# Patient Record
Sex: Male | Born: 1975 | Race: Black or African American | Hispanic: No | Marital: Single | Smoking: Never smoker
Health system: Southern US, Community
[De-identification: ages and names within clinical notes are randomized; demographics above are authoritative.]

## PROBLEM LIST (undated history)

## (undated) DIAGNOSIS — R569 Unspecified convulsions: Secondary | ICD-10-CM

## (undated) HISTORY — PX: OTHER SURGICAL HISTORY: SHX169

## (undated) HISTORY — DX: Unspecified convulsions: R56.9

---

## 1997-09-22 ENCOUNTER — Emergency Department (HOSPITAL_COMMUNITY): Admission: EM | Admit: 1997-09-22 | Discharge: 1997-09-22 | Payer: Self-pay | Admitting: Emergency Medicine

## 2002-04-20 ENCOUNTER — Emergency Department (HOSPITAL_COMMUNITY): Admission: EM | Admit: 2002-04-20 | Discharge: 2002-04-20 | Payer: Self-pay | Admitting: Emergency Medicine

## 2002-04-20 ENCOUNTER — Encounter: Payer: Self-pay | Admitting: Emergency Medicine

## 2003-12-28 ENCOUNTER — Emergency Department (HOSPITAL_COMMUNITY): Admission: EM | Admit: 2003-12-28 | Discharge: 2003-12-28 | Payer: Self-pay | Admitting: Emergency Medicine

## 2006-03-31 ENCOUNTER — Emergency Department (HOSPITAL_COMMUNITY): Admission: EM | Admit: 2006-03-31 | Discharge: 2006-03-31 | Payer: Self-pay | Admitting: Emergency Medicine

## 2009-08-24 ENCOUNTER — Emergency Department (HOSPITAL_COMMUNITY): Admission: EM | Admit: 2009-08-24 | Discharge: 2009-08-24 | Payer: Self-pay | Admitting: Emergency Medicine

## 2011-07-31 IMAGING — CR DG FINGER INDEX 2+V*L*
3 series · 3 of 3 positions shown · non-contrast
Comparison: None

CLINICAL DATA: Pain and distal index finger.  The patient popped
finger, joint yesterday.  Hurts worse today.  Swelling.

LEFT INDEX FINGER 2+V

[x finger pa right]
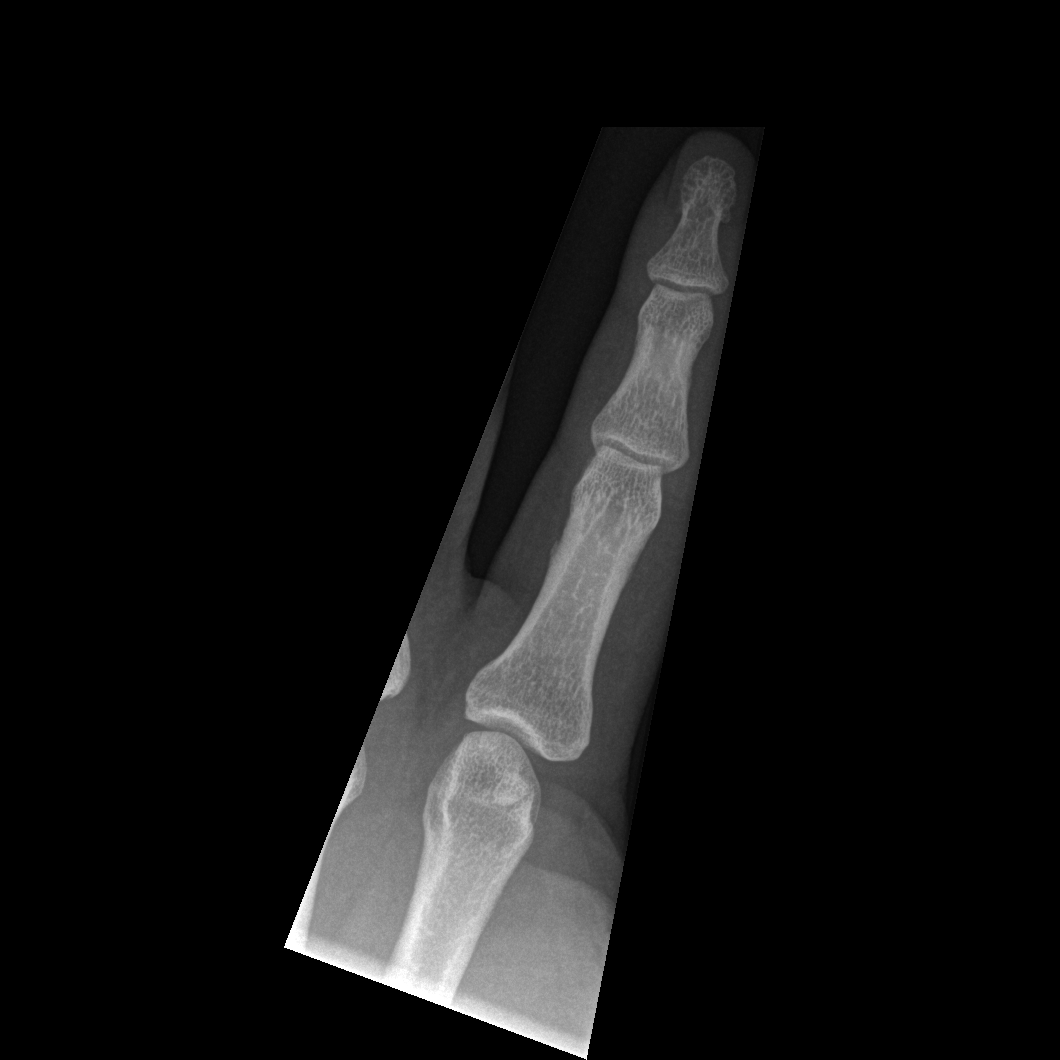

[x finger obl. right]
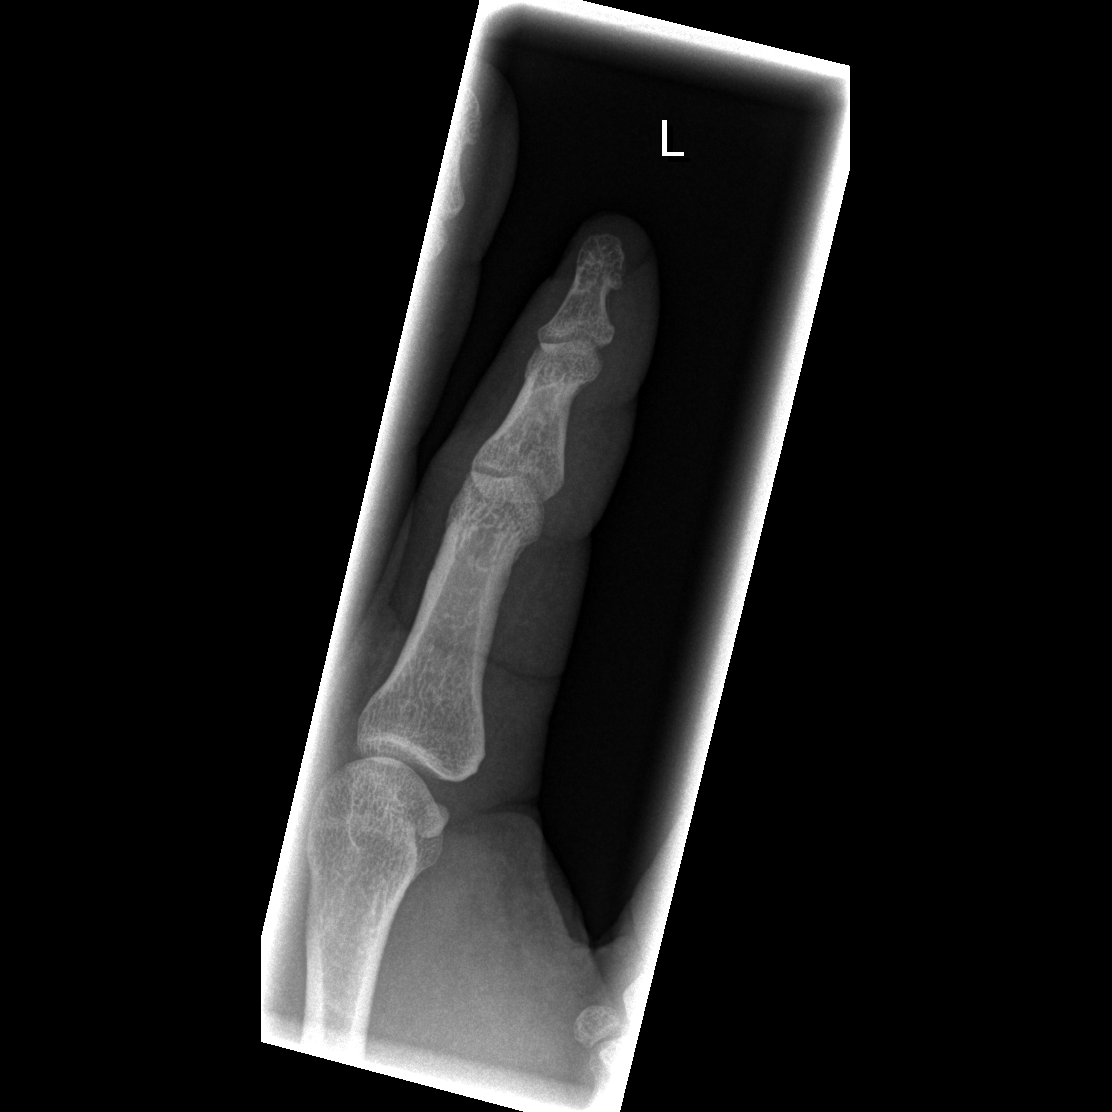

[x finger lateral right]
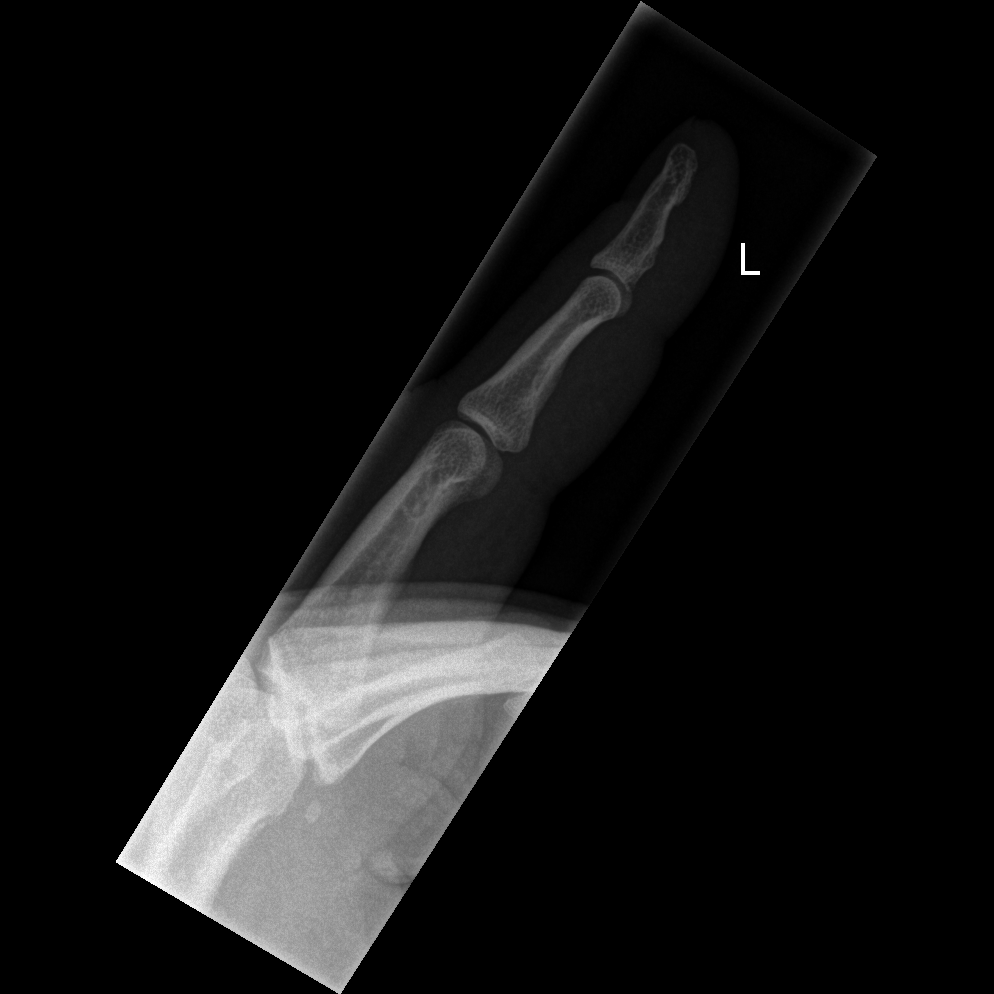

[3 of 3 positions shown; findings below may reference images not displayed]

FINDINGS: There is no evidence for acute fracture or dislocation.
No soft tissue foreign body or gas identified.
IMPRESSION: Negative exam.

## 2012-01-10 ENCOUNTER — Emergency Department (HOSPITAL_COMMUNITY)
Admission: EM | Admit: 2012-01-10 | Discharge: 2012-01-10 | Disposition: A | Discharge status: Home / Self Care | Payer: BC Managed Care – PPO | Attending: Emergency Medicine | Admitting: Emergency Medicine

## 2012-01-10 ENCOUNTER — Encounter (HOSPITAL_COMMUNITY): Payer: Self-pay | Admitting: Family Medicine

## 2012-01-10 ENCOUNTER — Emergency Department (HOSPITAL_COMMUNITY): Payer: BC Managed Care – PPO

## 2012-01-10 DIAGNOSIS — R079 Chest pain, unspecified: Secondary | ICD-10-CM | POA: Insufficient documentation

## 2012-01-10 LAB — CBC WITH DIFFERENTIAL/PLATELET
Basophils Absolute: 0 10*3/uL (ref 0.0–0.1)
Basophils Relative: 0 % (ref 0–1)
Eosinophils Absolute: 0.2 10*3/uL (ref 0.0–0.7)
Eosinophils Relative: 2 % (ref 0–5)
HCT: 47 % (ref 39.0–52.0)
Hemoglobin: 16.2 g/dL (ref 13.0–17.0)
Lymphocytes Relative: 30 % (ref 12–46)
Lymphs Abs: 2.3 10*3/uL (ref 0.7–4.0)
MCH: 29.8 pg (ref 26.0–34.0)
MCHC: 34.5 g/dL (ref 30.0–36.0)
MCV: 86.4 fL (ref 78.0–100.0)
Monocytes Absolute: 0.5 10*3/uL (ref 0.1–1.0)
Monocytes Relative: 7 % (ref 3–12)
Neutro Abs: 4.7 10*3/uL (ref 1.7–7.7)
Neutrophils Relative %: 61 % (ref 43–77)
Platelets: 236 10*3/uL (ref 150–400)
RBC: 5.44 MIL/uL (ref 4.22–5.81)
RDW: 14 % (ref 11.5–15.5)
WBC: 7.7 10*3/uL (ref 4.0–10.5)

## 2012-01-10 LAB — COMPREHENSIVE METABOLIC PANEL
ALT: 16 U/L (ref 0–53)
AST: 19 U/L (ref 0–37)
Albumin: 4 g/dL (ref 3.5–5.2)
Alkaline Phosphatase: 83 U/L (ref 39–117)
BUN: 10 mg/dL (ref 6–23)
CO2: 24 mEq/L (ref 19–32)
Calcium: 9.5 mg/dL (ref 8.4–10.5)
Chloride: 101 mEq/L (ref 96–112)
Creatinine, Ser: 1.18 mg/dL (ref 0.50–1.35)
GFR calc Af Amer: >90 mL/min (ref >90)
GFR calc non Af Amer: 79 mL/min — ABNORMAL LOW (ref >90)
Glucose, Bld: 103 mg/dL — ABNORMAL HIGH (ref 70–99)
Potassium: 4.1 mEq/L (ref 3.5–5.1)
Sodium: 138 mEq/L (ref 135–145)
Total Bilirubin: 0.2 mg/dL — ABNORMAL LOW (ref 0.3–1.2)
Total Protein: 7.7 g/dL (ref 6.0–8.3)

## 2012-01-10 MED ORDER — OMEPRAZOLE 20 MG PO CPDR: 20.0000 mg | DELAYED_RELEASE_CAPSULE | Freq: Every day | ORAL | Status: AC

## 2012-01-10 NOTE — ED Notes (Signed)
Pt complaining of intermittent chest pain over the past year but worsening last night. sts pressure all over his chest and sharp pain in left breast area. sts sometimes he gets SOB and has some nausea. sts the pain radiated into his back. sts the pain is worse with deep breathing.

## 2012-01-10 NOTE — ED Notes (Signed)
Results of troponin per POCT standards:   0.00 

## 2012-01-10 NOTE — ED Provider Notes (Signed)
History     CSN: [id number]  Arrival date & time 01/10/12  1316   First MD Initiated Contact with Patient 01/10/12 1759      Chief Complaint  Patient presents with  . Chest Pain    (Consider location/radiation/quality/duration/timing/severity/associated sxs/prior treatment) HPI This 36 year old male has had about a year history of intermittent chest discomforts and slight shortness of breath that can last for hours to day or 2 a time every few weeks or so. His symptoms are nonexertional and nonpleuritic. They are sometimes associated with eating certain foods making the discomfort, however. He has had occasional nausea and self-induced emesis when he said the discomfort for several hours after eating certain foods. He is no bloody vomiting. He is no bloody stools. He has no abdominal pain. His chest discomfort is felt as a gradual onset mild pressure-like and become moderately severe at times but again is nonexertional. He did workout at the gym without difficulty whatsoever. He can walk without difficulty whatsoever. He is no fever cough exertional shortness of breath leg swelling leg pain recent travel or immobilization. There is no treatment prior to arrival. He said his current symptoms since yesterday all evening yesterday and all night and all day today without change in activity. His mild chest pressure sensation is across the entire anterior chest without radiation or associated symptoms today. History reviewed. No pertinent past medical history.  No past surgical history on file.  No family history on file.  History  Substance Use Topics  . Smoking status: Not on file  . Smokeless tobacco: Not on file  . Alcohol Use: Not on file      Review of Systems 10 Systems reviewed and are negative for acute change except as noted in the HPI. Allergies  Review of patient's allergies indicates no known allergies.  Home Medications   Current Outpatient Rx  Name Route Sig Dispense  Refill  . OMEPRAZOLE 20 MG PO CPDR Oral Take 1 capsule (20 mg total) by mouth daily. 5 capsule 0    BP 150/102  Pulse 63  Temp 98.5 F (36.9 C) (Oral)  Resp 16  SpO2 100%  Physical Exam  Nursing note and vitals reviewed. Constitutional:       Awake, alert, nontoxic appearance.  HENT:  Head: Atraumatic.  Eyes: Right eye exhibits no discharge. Left eye exhibits no discharge.  Neck: Neck supple.  Cardiovascular: Normal rate and regular rhythm.   No murmur heard. Pulmonary/Chest: Effort normal and breath sounds normal. No respiratory distress. He has no wheezes. He has no rales. He exhibits no tenderness.  Abdominal: Soft. There is no tenderness. There is no rebound.  Musculoskeletal: He exhibits no edema and no tenderness.       Baseline ROM, no obvious new focal weakness.  Neurological:       Mental status and motor strength appears baseline for patient and situation.  Skin: No rash noted.  Psychiatric: He has a normal mood and affect.    ED Course  Procedures (including critical care time) ECG: (performed 1325) reviewed 1800 normal sinus rhythm, ventricular rate 83, normal axis, normal intervals, nonspecific T wave changes, no comparison ECG available  PERC neg.  Labs Reviewed  COMPREHENSIVE METABOLIC PANEL - Abnormal; Notable for the following:    Glucose, Bld 103 (*)     Total Bilirubin 0.2 (*)     GFR calc non Af Amer 79 (*)     All other components within normal limits  CBC WITH DIFFERENTIAL  LAB REPORT - SCANNED   Dg Chest 2 View  01/10/2012  *RADIOLOGY REPORT*  Clinical Data: Chest pain  CHEST - 2 VIEW  Comparison: None.  Findings: Normal heart, mediastinal, and hilar contours.  Normal pulmonary vascularity.  The lungs are normally expanded and clear. There is no pleural effusion.  The trachea is midline.  The bony thorax and visualized upper abdomen appear within normal limits  IMPRESSION: Normal chest radiograph.   Original Report Authenticated By: Britta Mccreedy,  M.D.      1. Chest pain       MDM  Patient / Family / Caregiver understand and agree with initial ED impression and plan with expectations set for ED visit. Pt stable in ED with no significant deterioration in condition. Patient / Family / Caregiver informed of clinical course, understand medical decision-making process, and agree with plan. I doubt any other EMC precluding discharge at this time including, but not necessarily limited to the following:ACS, PE.        Hurman Horn, MD 01/11/12 2245

## 2012-10-02 ENCOUNTER — Telehealth: Payer: Self-pay

## 2012-10-02 NOTE — Telephone Encounter (Signed)
PT STATES HE WAS GIVEN A CREAM AND NEVER GOT IT FILLED, HE WOULD LIKE [place] TO CALL IT IN FOR HIM. PLEASE CALL [phone]   [facility] ON [address]

## 2012-10-02 NOTE — Telephone Encounter (Signed)
Patient calling again to see if his Rx for the cream that he didn't pick up can be filled again because he said the request was expired.   [phone]

## 2012-10-02 NOTE — Telephone Encounter (Signed)
Left message to return call. Don't see OV. Need details

## 2012-10-03 NOTE — Telephone Encounter (Signed)
Advised pt that he would need an office visit.  He was given an rx for Elimite Cream by Dr. 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

## 2014-02-14 ENCOUNTER — Ambulatory Visit (INDEPENDENT_AMBULATORY_CARE_PROVIDER_SITE_OTHER): Payer: No Typology Code available for payment source | Admitting: Neurology

## 2014-02-14 ENCOUNTER — Encounter: Payer: Self-pay | Admitting: Neurology

## 2014-02-14 VITALS — BP 130/88 | HR 74 | Ht 70.0 in | Wt 230.0 lb

## 2014-02-14 DIAGNOSIS — R569 Unspecified convulsions: Secondary | ICD-10-CM

## 2014-02-14 DIAGNOSIS — R55 Syncope and collapse: Secondary | ICD-10-CM

## 2014-02-14 DIAGNOSIS — R5601 Complex febrile convulsions: Secondary | ICD-10-CM

## 2014-02-14 MED ORDER — SUVOREXANT 20 MG PO TABS: 20.0000 mg | ORAL_TABLET | Freq: Every evening | ORAL | Status: AC | PRN

## 2014-02-14 NOTE — Progress Notes (Signed)
[facility]    Provider:  Dr Lucia GaskinsAhern Referring Provider: Milus Heightedmon, Noelle, PA-C Primary Care Physician:  REDMON,NOELLE, PA-C  CC:  Seizures  HPI:  Douglas Armstrong is a 38 y.o. male here as a referral from Dr. Sherlyn Lickedmon for seizures vs syncope/presyncope.  He has episodes where he falls down and goes out for a second. All started 3-4 years ago. One day he felt dizzy and lightheaded and next thing he knew he was on the floor. Quick, completely blacks out briefly and he comes back and knows exactly where he is. Stress makes these episodes worse. He gets chest pain, SOB, feels heart racing. At other times when he gets dizzy, his vision gets real blurry and hard to focus and gets lightheaded and has to sit down or hold onto sometimes. Sometimes he completely goes black and falls for a moment. Once he dropped straight down and passed out by the TV and broke it.   One episode recently where he was on the couch and he was trying to talk and he was slurring speech and muffled hearing. He could hear what she was saying but couldn't respond, started drooling and when he came back he did not have confusion.   Passing out happens every now and then,  It may not happen in a week then may happen several times in a week. No FHx seizures. No seizures as a chiled    Reviewed notes, labs and imaging from outside physicians, which showed: cbc/cmp unremarkable  Review of Systems: Patient complains of symptoms per HPI as well as the following symptoms Insomnia. No pain. Pertinent negatives per HPI. All others negative.   History   Social History  . Marital Status: Single    Spouse Name: N/A    Number of Children: N/A  . Years of Education: N/A   Occupational History  . Not on file.   Social History Main Topics  . Smoking status: Never Smoker   . Smokeless tobacco: Never Used  . Alcohol Use: 8.4 oz/week    14 Cans of beer per week  . Drug Use: Not on file  . Sexual Activity:  Not on file   Other Topics Concern  . Not on file   Social History Narrative  . No narrative on file    Family History  Problem Relation Age of Onset  . Sleep apnea Mother     History reviewed. No pertinent past medical history.  History reviewed. No pertinent past surgical history.  Current Outpatient Prescriptions  Medication Sig Dispense Refill  . omeprazole (PRILOSEC) 20 MG capsule Take 1 capsule (20 mg total) by mouth daily.  5 capsule  0  . Suvorexant (BELSOMRA) 20 MG TABS Take 20 mg by mouth at bedtime as needed.  3 tablet  0   No current facility-administered medications for this visit.    Allergies as of 02/14/2014  . (No Known Allergies)    Vitals: BP 130/88  Pulse 74  Ht 5\' 10"  (1.778 m)  Wt 230 lb (104.327 kg)  BMI 33.00 kg/m2 Last Weight:  Wt Readings from Last 1 Encounters:  02/14/14 230 lb (104.327 kg)   Last Height:   Ht Readings from Last 1 Encounters:  02/14/14 5\' 10"  (1.778 m)    Physical exam: Exam: Gen: NAD, conversant, well nourised, obese, well groomed                     CV: RRR, no MRG. No  Carotid Bruits. No peripheral edema, warm, nontender Eyes: Conjunctivae clear without exudates or hemorrhage  Neuro: Detailed Neurologic Exam  Speech:    Speech is normal; fluent and spontaneous with normal comprehension.  Cognition:    The patient is oriented to person, place, and time;     recent and remote memory intact;     language fluent;     normal attention, concentration,     fund of knowledge Cranial Nerves:    The pupils are equal, round, and reactive to light. The fundi are normal and spontaneous venous pulsations are present. Visual fields are full to finger confrontation. Extraocular movements are intact. Trigeminal sensation is intact and the muscles of mastication are normal. The face is symmetric. The palate elevates in the midline. Voice is normal. Shoulder shrug is normal. The tongue has normal motion without fasciculations.     Coordination:    Normal finger to nose and heel to shin. Normal rapid alternating movements.   Gait:    Heel-toe and tandem gait are normal.   Motor Observation:    No asymmetry, no atrophy, and no involuntary movements noted. Tone:    Normal muscle tone.    Posture:    Posture is normal. normal erect    Strength:    Strength is V/V in the upper and lower limbs.      Sensation: intact     Reflex Exam:  DTR's:    Deep tendon reflexes in the upper and lower extremities are normal bilaterally.   Toes:    The toes are downgoing bilaterally.   Clonus:    Clonus is absent.      Assessment/Plan:  38 year old male here for evaluation. Seizure vs syncope/presyncope. Neuro exam non focal. Belsomra for insomnia.  MRI of the brain w/wo contrast EEG Carotid Dopplers Cardiology  No driving until workup complete. Do not bathe alone or operate heave equipment or anything that can cause harm to you or others should you have an event.   Naomie DeanAntonia Quenna Doepke, MD  [facility] [address] [place], [place] [id number]  Phone [phone] Fax [phone]

## 2014-02-14 NOTE — Progress Notes (Deleted)
Subjective:    Patient ID: Douglas Armstrong is a 38 y.o. male.  HPI {Common ambulatory SmartLinks:19316}  Review of Systems  Objective:  Neurologic Exam  Physical Exam  Assessment:     Plan:

## 2014-02-14 NOTE — Patient Instructions (Addendum)
Overall you are doing fairly well but I do want to suggest a few things today:   Remember to drink plenty of fluid, eat healthy meals and do not skip any meals. Try to eat protein with a every meal and eat a healthy snack such as fruit or nuts in between meals. Try to keep a regular sleep-wake schedule and try to exercise daily, particularly in the form of walking, 20-30 minutes a day, if you can.   As far as your medications are concerned, I would like to suggest: Baby Aspirin daily  As far as diagnostic testing: MRI brain, EEG, Cardiology, Carotid Dopplers  I would like to see you back after workup, sooner if we need to. Please call us with any interim questions, concerns, problems, updates or refill requests.   Please also call us for any test results so we can go over those with you on the phone.  My clinical assistant and will answer any of your questions and relay your messages to me and also relay most of my messages to you.   Our phone number is [phone]. We also have an after hours call service for urgent matters and there is a physician on-call for urgent questions. For any emergencies you know to call 911 or go to the nearest emergency room  No driving until workup complete. Do not bathe alone or operate heave equipment or anything that can cause harm to you or others should you have an event.

## 2014-02-21 ENCOUNTER — Other Ambulatory Visit: Payer: No Typology Code available for payment source

## 2014-03-07 ENCOUNTER — Other Ambulatory Visit: Payer: No Typology Code available for payment source

## 2014-03-17 NOTE — Progress Notes (Signed)
Patient ID: Douglas Armstrong, male   DOB: 1975/09/09, 38 y.o.   MRN: [id number]   38 yo referred by Dr Daisy BlossomAhearn neurology  for syncope   He has episodes where he falls down and goes out for a second. All started 3-4 years ago. One day he felt dizzy and lightheaded and next thing he knew he was on the floor. Quick, completely blacks out briefly and he comes back and knows exactly where he is. Stress makes these episodes worse. He gets chest pain, SOB, feels heart racing. At other times when he gets dizzy, his vision gets real blurry and hard to focus and gets lightheaded and has to sit down or hold onto sometimes. Sometimes he completely goes black and falls for a moment. Once he dropped straight down and passed out by the TV and broke it.   One episode recently where he was on the couch and he was trying to talk and he was slurring speech and muffled hearing. He could hear what she was saying but couldn't respond, started drooling and when he came back he did not have confusion.  Passing out happens every now and then, It may not happen in a week then may happen several times in a week. No FHx seizures. No seizures as a chiled     ROS: Denies fever, malais, weight loss, blurry vision, decreased visual acuity, cough, sputum, SOB, hemoptysis, pleuritic pain, palpitaitons, heartburn, abdominal pain, melena, lower extremity edema, claudication, or rash.  All other systems reviewed and negative   General: Affect appropriate Healthy:  appears stated age HEENT: normal Neck supple with no adenopathy JVP normal no bruits no thyromegaly Lungs clear with no wheezing and good diaphragmatic motion Heart:  S1/S2 no murmur,rub, gallop or click PMI normal Abdomen: benighn, BS positve, no tenderness, no AAA no bruit.  No HSM or HJR Distal pulses intact with no bruits No edema Neuro non-focal Skin warm and dry No muscular weakness  Medications Current Outpatient Prescriptions  Medication Sig  Dispense Refill  . omeprazole (PRILOSEC) 20 MG capsule Take 1 capsule (20 mg total) by mouth daily. 5 capsule 0  . Suvorexant (BELSOMRA) 20 MG TABS Take 20 mg by mouth at bedtime as needed. 3 tablet 0   No current facility-administered medications for this visit.    Allergies Review of patient's allergies indicates no known allergies.  Family History: Family History  Problem Relation Age of Onset  . Sleep apnea Mother   . Seizures Neg Hx     Social History: History   Social History  . Marital Status: Single    Spouse Name: N/A    Number of Children: N/A  . Years of Education: N/A   Occupational History  . Not on file.   Social History Main Topics  . Smoking status: Never Smoker   . Smokeless tobacco: Never Used  . Alcohol Use: 8.4 oz/week    14 Cans of beer per week  . Drug Use: Not on file  . Sexual Activity: Not on file   Other Topics Concern  . Not on file   Social History Narrative    Past Surgical History  Procedure Laterality Date  . None      Past Medical History  Diagnosis Date  . Seizures     Electrocardiogram:   9/13  SR normal ECG normal QT 360 Assessment and Plan

## 2014-03-18 ENCOUNTER — Encounter: Payer: BC Managed Care – PPO | Admitting: Cardiovascular Disease

## 2014-03-21 ENCOUNTER — Telehealth: Payer: Self-pay | Admitting: Radiology

## 2014-04-09 ENCOUNTER — Encounter: Payer: Self-pay | Admitting: Cardiovascular Disease

## 2014-04-22 ENCOUNTER — Telehealth: Payer: Self-pay | Admitting: Radiology

## 2014-04-22 NOTE — Telephone Encounter (Signed)
Left numerous messages for patient to call for scheduling the carotid doppler. I have not received a reply. Letter sent to contact us.

## 2014-04-22 NOTE — Telephone Encounter (Signed)
04/22/14  Patient has not responded to attempts to reschedule no show appts.  Letter mailed 03/21/14 with no response.

## 2015-10-28 ENCOUNTER — Encounter (HOSPITAL_COMMUNITY): Payer: Self-pay | Admitting: *Deleted

## 2015-10-28 ENCOUNTER — Emergency Department (HOSPITAL_COMMUNITY)
Admission: EM | Admit: 2015-10-28 | Discharge: 2015-10-28 | Disposition: A | Discharge status: Home / Self Care | Payer: Self-pay | Attending: Emergency Medicine | Admitting: Emergency Medicine

## 2015-10-28 DIAGNOSIS — M25512 Pain in left shoulder: Secondary | ICD-10-CM | POA: Insufficient documentation

## 2015-10-28 MED ORDER — NAPROXEN 500 MG PO TABS: 500.0000 mg | ORAL_TABLET | Freq: Two times a day (BID) | ORAL | Status: AC

## 2015-10-28 MED ORDER — NAPROXEN 500 MG PO TABS
500.0000 mg | ORAL_TABLET | Freq: Once | ORAL | Status: AC
  Administered 2015-10-28: 500 mg via ORAL
  Filled 2015-10-28: qty 1

## 2015-10-28 NOTE — Discharge Instructions (Signed)
Shoulder Pain  The shoulder is the joint that connects your arms to your body. The bones that form the shoulder joint include the upper arm bone (humerus), the shoulder blade (scapula), and the collarbone (clavicle). The top of the humerus is shaped like a ball and fits into a rather flat socket on the scapula (glenoid cavity). A combination of muscles and strong, fibrous tissues that connect muscles to bones (tendons) support your shoulder joint and hold the ball in the socket. Small, fluid-filled sacs (bursae) are located in different areas of the joint. They act as cushions between the bones and the overlying soft tissues and help reduce friction between the gliding tendons and the bone as you move your arm. Your shoulder joint allows a wide range of motion in your arm. This range of motion allows you to do things like scratch your back or throw a ball. However, this range of motion also makes your shoulder more prone to pain from overuse and injury.  Causes of shoulder pain can originate from both injury and overuse and usually can be grouped in the following four categories:  1. Redness, swelling, and pain (inflammation) of the tendon (tendinitis) or the bursae (bursitis).  2. Instability, such as a dislocation of the joint.  3. Inflammation of the joint (arthritis).  4. Broken bone (fracture).  HOME CARE INSTRUCTIONS   1. Apply ice to the sore area.  ¨ Put ice in a plastic bag.  ¨ Place a towel between your skin and the bag.  ¨ Leave the ice on for 15-20 minutes, 3-4 times per day for the first 2 days, or as directed by your health care provider.  2. Stop using cold packs if they do not help with the pain.  3. If you have a shoulder sling or immobilizer, wear it as long as your caregiver instructs. Only remove it to shower or bathe. Move your arm as little as possible, but keep your hand moving to prevent swelling.  4. Squeeze a soft ball or foam pad as much as possible to help prevent swelling.  5. Only take  over-the-counter or prescription medicines for pain, discomfort, or fever as directed by your caregiver.  SEEK MEDICAL CARE IF:   1. Your shoulder pain increases, or new pain develops in your arm, hand, or fingers.  2. Your hand or fingers become cold and numb.  3. Your pain is not relieved with medicines.  SEEK IMMEDIATE MEDICAL CARE IF:   1. Your arm, hand, or fingers are numb or tingling.  2. Your arm, hand, or fingers are significantly swollen or turn white or blue.  MAKE SURE YOU:   1. Understand these instructions.  2. Will watch your condition.  3. Will get help right away if you are not doing well or get worse.     This information is not intended to replace advice given to you by your health care provider. Make sure you discuss any questions you have with your health care provider.     Document Released: 01/13/2005 Document Revised: 04/26/2014 Document Reviewed: 07/29/2014  Elsevier Interactive Patient Education ©2016 [employer]  Shoulder Range of Motion Exercises  Shoulder range of motion (ROM) exercises are designed to keep the shoulder moving freely. They are often recommended for people who have shoulder pain.  MOVEMENT EXERCISE  When you are able, do this exercise 5-6 days per week, or as told by your health care provider. Work toward doing 2 sets of 10 swings.  Pendulum   Exercise  How To Do This Exercise Lying Down  5. Lie face-down on a bed with your abdomen close to the side of the bed.  6. Let your arm hang over the side of the bed.  7. Relax your shoulder, arm, and hand.  8. Slowly and gently swing your arm forward and back. Do not use your neck muscles to swing your arm. They should be relaxed. If you are struggling to swing your arm, have someone gently swing it for you. When you do this exercise for the first time, swing your arm at a 15 degree angle for 15 seconds, or swing your arm 10 times. As pain lessens over time, increase the angle of the swing to 30-45 degrees.  9. Repeat steps 1-4  with the other arm.  How To Do This Exercise While Standing  6. Stand next to a sturdy chair or table and hold on to it with your hand.  ¨ Bend forward at the waist.  ¨ Bend your knees slightly.  ¨ Relax your other arm and let it hang limp.  ¨ Relax the shoulder blade of the arm that is hanging and let it drop.  ¨ While keeping your shoulder relaxed, use body motion to swing your arm in small circles. The first time you do this exercise, swing your arm for about 30 seconds or 10 times. When you do it next time, swing your arm for a little longer.  ¨ Stand up tall and relax.  ¨ Repeat steps 1-7, this time changing the direction of the circles.  7. Repeat steps 1-8 with the other arm.  STRETCHING EXERCISES  Do these exercises 3-4 times per day on 5-6 days per week or as told by your health care provider. Work toward holding the stretch for 20 seconds.  Stretching Exercise 1  4. Lift your arm straight out in front of you.  5. Bend your arm 90 degrees at the elbow (right angle) so your forearm goes across your body and looks like the letter "L."  6. Use your other arm to gently pull the elbow forward and across your body.  7. Repeat steps 1-3 with the other arm.  Stretching Exercise 2  You will need a towel or rope for this exercise.  3. Bend one arm behind your back with the palm facing outward.  4. Hold a towel with your other hand.  5. Reach the arm that holds the towel above your head, and bend that arm at the elbow. Your wrist should be behind your neck.  6. Use your free hand to grab the free end of the towel.  7. With the higher hand, gently pull the towel up behind you.  8. With the lower hand, pull the towel down behind you.  9. Repeat steps 1-6 with the other arm.  STRENGTHENING EXERCISES  Do each of these exercises at four different times of day (sessions) every day or as told by your health care provider. To begin with, repeat each exercise 5 times (repetitions). Work toward doing 3 sets of 12 repetitions or  as told by your health care provider.  Strengthening Exercise 1  You will need a light weight for this activity. As you grow stronger, you may use a heavier weight.  4. Standing with a weight in your hand, lift your arm straight out to the side until it is at the same height as your shoulder.  5. Bend your arm at 90 degrees so that your   fingers are pointing to the ceiling.  6. Slowly raise your hand until your arm is straight up in the air.  7. Repeat steps 1-3 with the other arm.  Strengthening Exercise 2  You will need a light weight for this activity. As you grow stronger, you may use a heavier weight.  1. Standing with a weight in your hand, gradually move your straight arm in an arc, starting at your side, then out in front of you, then straight up over your head.  2. Gradually move your other arm in an arc, starting at your side, then out in front of you, then straight up over your head.  3. Repeat steps 1-2 with the other arm.  Strengthening Exercise 3  You will need an elastic band for this activity. As you grow stronger, gradually increase the size of the bands or increase the number of bands that you use at one time.  1. While standing, hold an elastic band in one hand and raise that arm up in the air.  2. With your other hand, pull down the band until that hand is by your side.  3. Repeat steps 1-2 with the other arm.     This information is not intended to replace advice given to you by your health care provider. Make sure you discuss any questions you have with your health care provider.     Document Released: 01/02/2003 Document Revised: 08/20/2014 Document Reviewed: 04/01/2014  Elsevier Interactive Patient Education ©2016 [employer]

## 2015-10-28 NOTE — ED Provider Notes (Signed)
CSN: [id number]     Arrival date & time 10/28/15  1808 History  By signing my name below, I, Placido Sou, attest that this documentation has been prepared under the direction and in the presence of Blakley Michna, PA-C.  Electronically Signed: Placido Sou, ED Scribe. 10/28/2015. 6:50 PM.   Chief Complaint  Patient presents with  . Shoulder Pain   The history is provided by the patient. No language interpreter was used.    HPI Comments: Douglas Armstrong is a 40 y.o. male who presents to the Emergency Department complaining of chronic, mild, waxing and waning, diffuse, left shoulder pain x 3 years which worsened in the past few days. The pain is generalized over his shoulder and he notes that lifting the arm to reach objects on high shelves exacerbates the pain. Aleve provides mild relief. Denies weakness, numbness or tingling in the arm. Pt states he was seen at an UC 3 years ago for the same symptoms and received a Cortisone shot which he states relieved the pain for a short period of time with it gradually returning. He states that he used to carry heavy objects on his shoulder for work prior to the onset of his chronic pain. He denies specific injury to the shoulder but believes it is related to his prior work of heavy lifting. He has never followed with his PCP or an orthopedic doctor for this complaint.   PCP: [facility]   Past Medical History  Diagnosis Date  . Seizures [facility])    Past Surgical History  Procedure Laterality Date  . None     Family History  Problem Relation Age of Onset  . Sleep apnea Mother   . Seizures Neg Hx    Social History  Substance Use Topics  . Smoking status: Never Smoker   . Smokeless tobacco: Never Used  . Alcohol Use: 8.4 oz/week    14 Cans of beer per week    Review of Systems  Musculoskeletal: Positive for arthralgias. Negative for joint swelling.  Skin: Negative for color change and wound.  Neurological: Negative for numbness.   All other systems reviewed and are negative.   Allergies  Review of patient's allergies indicates no known allergies.  Home Medications   Prior to Admission medications   Medication Sig Start Date End Date Taking? Authorizing Provider  naproxen (NAPROSYN) 500 MG tablet Take 1 tablet (500 mg total) by mouth 2 (two) times daily. 10/28/15   Maricia Scotti, PA-C  omeprazole (PRILOSEC) 20 MG capsule Take 1 capsule (20 mg total) by mouth daily. 01/10/12   Wayland Salinas, MD  Suvorexant (BELSOMRA) 20 MG TABS Take 20 mg by mouth at bedtime as needed. 02/14/14   Anson Fret, MD   BP 155/95 mmHg  Pulse 83  Temp(Src) 98.6 F (37 C) (Oral)  Resp 19  Ht  (1.778 m)  Wt 104.327 kg  BMI 33.00 kg/m2  SpO2 98% Physical Exam  Constitutional: He appears well-developed and well-nourished. No distress.  HENT:  Head: Normocephalic and atraumatic.  Right Ear: External ear normal.  Left Ear: External ear normal.  Eyes: Conjunctivae are normal. Right eye exhibits no discharge. Left eye exhibits no discharge. No scleral icterus.  Neck: Normal range of motion.  Cardiovascular: Normal rate and intact distal pulses.   Pulmonary/Chest: Effort normal.  Musculoskeletal:       Left shoulder: He exhibits decreased range of motion. He exhibits no tenderness, no swelling, no deformity, normal pulse and normal strength.  No tenderness to palpation along the left clavicle or shoulder. No swelling or obvious deformity. Decreased active range of motion past 90 of extension and abduction secondary to pain. Full passive range of motion intact. Positive Hawkins. Positive Neer's. Radial pulse strong.  Neurological: He is alert. Coordination normal.  5/5 strength of the bilateral upper extremities with pain on testing the left shoulder. Sensation to light touch intact.  Skin: Skin is warm and dry.  Psychiatric: He has a normal mood and affect. His behavior is normal.  Nursing note and vitals reviewed.   ED  Course  Procedures  DIAGNOSTIC STUDIES: Oxygen Saturation is 98% on RA, normal by my interpretation.    COORDINATION OF CARE: 6:47 PM Discussed next steps with pt. Pt verbalized understanding and is agreeable with the plan.   Labs Review Labs Reviewed - No data to display  Imaging Review No results found.   EKG Interpretation None      MDM   Final diagnoses:  Left shoulder pain   Patient presenting with left shoulder pain x 2-3 years. Left upper extremity is neurovascularly intact with full passive ROM. No TTP or deformity. Positive Hawkins and Neers. Given length of symptoms, no Xray ordered. Pain consistent with rotator cuff pathology. Pain managed in ED with naprosyn. Discussed RICE therapy and use of OTC pain relievers. Pt advised to follow up with orthopedics if symptoms persist. Return precautions discussed at bedside and given in discharge paperwork. Pt is stable for discharge.  I personally performed the services described in this documentation, which was scribed in my presence. The recorded information has been reviewed and is accurate.    Alveta HeimlichStevi Jeweliana Dudgeon, PA-C 10/28/15 1905  Maia PlanJoshua G Long, MD 10/29/15 1000

## 2015-10-28 NOTE — ED Notes (Signed)
Pt complains of left shoulder pain for the past 2 years. Pt states he feels like the pain is getting worse and is tired of dealing with the pain. Pt states he had a cortisone shot 2 years ago, which he states helped.

## 2016-04-13 ENCOUNTER — Emergency Department (HOSPITAL_COMMUNITY)
Admission: EM | Admit: 2016-04-13 | Discharge: 2016-04-13 | Disposition: A | Discharge status: Home / Self Care | Payer: Self-pay | Attending: Dermatology | Admitting: Dermatology

## 2016-04-13 ENCOUNTER — Encounter (HOSPITAL_COMMUNITY): Payer: Self-pay | Admitting: Emergency Medicine

## 2016-04-13 DIAGNOSIS — Z5321 Procedure and treatment not carried out due to patient leaving prior to being seen by health care provider: Secondary | ICD-10-CM | POA: Insufficient documentation

## 2016-04-13 DIAGNOSIS — R51 Headache: Secondary | ICD-10-CM | POA: Insufficient documentation

## 2016-04-13 NOTE — ED Triage Notes (Signed)
Pt c/o progressive headache to bilateral temples with head pressure onset 7 days ago, photophobia, sound sensitivity. No vision changes. No hx of similar headaches. Self-treating with excedrin which relieved symptoms on first day, not offers no relief.

## 2016-07-19 ENCOUNTER — Ambulatory Visit (INDEPENDENT_AMBULATORY_CARE_PROVIDER_SITE_OTHER): Payer: BLUE CROSS/BLUE SHIELD | Admitting: Physician Assistant

## 2016-07-19 VITALS — BP 142/92 | HR 42 | Resp 16

## 2016-07-19 DIAGNOSIS — Z23 Encounter for immunization: Secondary | ICD-10-CM | POA: Diagnosis not present

## 2016-07-19 DIAGNOSIS — Z299 Encounter for prophylactic measures, unspecified: Secondary | ICD-10-CM

## 2016-07-19 DIAGNOSIS — S61411A Laceration without foreign body of right hand, initial encounter: Secondary | ICD-10-CM

## 2016-07-19 NOTE — Patient Instructions (Addendum)

## 2016-07-19 NOTE — Progress Notes (Signed)
  07/19/2016 11:25 AM   DOB: [birth year] / MRN: [id number]  SUBJECTIVE:  Douglas Armstrong is a 41 y.o. male presenting for a laceration that occurred today while trimming some bushes at his home.  Tells me he was using a knife which slipped. Reports copious bleeding.  He applied pressure and came straight here. Denies any weakness or numbness of the left hand.    Immunization History  Administered Date(s) Administered  . Tdap 07/19/2016    He has No Known Allergies.   Review of Systems  Neurological: Negative for tingling, sensory change and focal weakness.    The problem list and medications were reviewed and updated by myself where necessary and exist elsewhere in the encounter.   OBJECTIVE:  BP (!) 142/92   Pulse (!) 42   Resp 16   Physical Exam  Constitutional: He is oriented to person, place, and time.  Neurological: He is alert and oriented to person, place, and time. He has normal strength. No sensory deficit.  Skin: Skin is warm and dry.  Oblique laceration of the right hypothenar eminence.  Wound is shallow.  Some subcutaneous fat visible on the wound however now deep structures identified on close inspection.           Risk and benefits discussed and verbal consent obtained. Anesthetic allergies reviewed. Patient anesthetized using 1:1 mix of 2% lidocaine with epi and Marcaine. The wound was cleansed thoroughly with soap and water. Sterile prep and drape. Wound closed with 5 HM throws using 4-0 Ethilon suture material. Hemostasis achieved. Mupirocin applied to the wound and bandage placed. The patient tolerated well. Wound instructions were provided and the patient is to return in 10 days for suture removal.   ASSESSMENT AND PLAN:  Diagnoses and all orders for this visit:  Laceration of right hand without foreign body, initial encounter: NVB intact. Reparied per photo.  He tolerated the procedure well.  Wound care outlined on AVS.    Need for prophylactic  measure -     Tdap vaccine greater than or equal to 7yo IM    The patient is advised to call or return to clinic if he does not see an improvement in symptoms, or to seek the care of the closest emergency department if he worsens with the above plan.   Deliah Boston, MHS, PA-C [facility] 07/19/2016 11:25 AM

## 2016-07-29 ENCOUNTER — Ambulatory Visit (INDEPENDENT_AMBULATORY_CARE_PROVIDER_SITE_OTHER): Payer: BLUE CROSS/BLUE SHIELD | Admitting: Physician Assistant

## 2016-07-29 VITALS — BP 130/84 | HR 72 | Temp 98.8°F | Resp 17 | Ht 70.0 in | Wt 222.0 lb

## 2016-07-29 DIAGNOSIS — S61411D Laceration without foreign body of right hand, subsequent encounter: Secondary | ICD-10-CM

## 2016-07-29 NOTE — Patient Instructions (Addendum)
Thank you for letting me participate in your health and well being.     IF you received an x-ray today, you will receive an invoice from [facility]. Please contact [facility] at [phone] with questions or concerns regarding your invoice.   IF you received labwork today, you will receive an invoice from [facility]. Please contact [facility] at [phone] with questions or concerns regarding your invoice.   Our billing staff will not be able to assist you with questions regarding bills from these companies.  You will be contacted with the lab results as soon as they are available. The fastest way to get your results is to activate your My Chart account. Instructions are located on the last page of this paperwork. If you have not heard from us regarding the results in 2 weeks, please contact this office.      

## 2016-07-29 NOTE — Progress Notes (Signed)
   Patient: Douglas Armstrong [id number]  Subjective: Chamberlain is returning for suture removal. Patient was initially seen 07/19/16 and had 5 horizontal mattress sutures   placed. Denies fever, drainage of pus or blood, wound dehiscence, edema, pain.   Objective: Physical Exam  Constitutional: He is oriented to person, place, and time and well-developed, well-nourished, and in no distress.  HENT:  Head: Normocephalic and atraumatic.  Eyes: Conjunctivae are normal.  Neck: Normal range of motion.  Pulmonary/Chest: Effort normal.  Neurological: He is alert and oriented to person, place, and time. Gait normal.  Skin: Skin is warm and dry.  Well healed laceration on right hand. Sutures intact. No surrounding erythema, warmth, or purulent drainage.   Psychiatric: Affect normal.  Vitals reviewed.   # 5 horizontal mattress sutures removed without incident. Patient tolerated this well.  Assessment and Plan: 1. Laceration of right hand without foreign body, subsequent encounter Well-healed wound. Anticipatory guidance provided. Return to clinic as needed.  Benjiman Core, PA-C  [facility] 07/29/2016 2:48 PM

## 2017-10-04 ENCOUNTER — Encounter (HOSPITAL_BASED_OUTPATIENT_CLINIC_OR_DEPARTMENT_OTHER): Payer: Self-pay | Admitting: Adult Health

## 2017-10-04 ENCOUNTER — Other Ambulatory Visit: Payer: Self-pay

## 2017-10-04 ENCOUNTER — Emergency Department (HOSPITAL_BASED_OUTPATIENT_CLINIC_OR_DEPARTMENT_OTHER)
Admission: EM | Admit: 2017-10-04 | Discharge: 2017-10-04 | Disposition: A | Discharge status: Home / Self Care | Payer: Self-pay | Attending: Emergency Medicine | Admitting: Emergency Medicine

## 2017-10-04 DIAGNOSIS — Z79899 Other long term (current) drug therapy: Secondary | ICD-10-CM | POA: Insufficient documentation

## 2017-10-04 DIAGNOSIS — R42 Dizziness and giddiness: Secondary | ICD-10-CM | POA: Insufficient documentation

## 2017-10-04 LAB — CBC
HEMATOCRIT: 44.3 % (ref 39.0–52.0)
Hemoglobin: 15.4 g/dL (ref 13.0–17.0)
MCH: 29.3 pg (ref 26.0–34.0)
MCHC: 34.8 g/dL (ref 30.0–36.0)
MCV: 84.4 fL (ref 78.0–100.0)
PLATELETS: 215 10*3/uL (ref 150–400)
RBC: 5.25 MIL/uL (ref 4.22–5.81)
RDW: 13.8 % (ref 11.5–15.5)
WBC: 5.5 10*3/uL (ref 4.0–10.5)

## 2017-10-04 LAB — BASIC METABOLIC PANEL
ANION GAP: 10 (ref 5–15)
BUN: 14 mg/dL (ref 6–20)
CALCIUM: 8.7 mg/dL — AB (ref 8.9–10.3)
CO2: 26 mmol/L (ref 22–32)
Chloride: 101 mmol/L (ref 101–111)
Creatinine, Ser: 1.22 mg/dL (ref 0.61–1.24)
GFR calc Af Amer: >60 mL/min (ref >60)
Glucose, Bld: 89 mg/dL (ref 65–99)
Potassium: 3.5 mmol/L (ref 3.5–5.1)
Sodium: 137 mmol/L (ref 135–145)

## 2017-10-04 NOTE — ED Provider Notes (Signed)
[facility] EMERGENCY DEPARTMENT Provider Note   CSN: [id number] Arrival date & time: 10/04/17  1253     History   Chief Complaint Chief Complaint  Patient presents with  . Dizziness    HPI Douglas Armstrong is a 42 y.o. male.  HPI Patient is a 42 year old male presents the emergency department with lightheadedness over the past 24 hours.  Denies melena or hematochezia.  Denies nausea vomiting diarrhea.  Reports he feels lightheaded when he stands.  Denies dysuria or urinary frequency.  No history of diabetes.  No headaches.  No weakness of his arms or legs.  He states for weeks he has had intermittent numbness of his left forearm that is mainly present at nighttime.  He has no weakness or numbness at this time.  No other complaints.  No recent illness.  No chest pain or shortness of breath.  No abdominal pain.   Past Medical History:  Diagnosis Date  . Seizures [facility](HCC)     Patient Active Problem List   Diagnosis Date Noted  . Seizures (HCC) 02/14/2014    Past Surgical History:  Procedure Laterality Date  . none          Home Medications    Prior to Admission medications   Medication Sig Start Date End Date Taking? Authorizing Provider  naproxen (NAPROSYN) 500 MG tablet Take 1 tablet (500 mg total) by mouth 2 (two) times daily. 10/28/15   Barrett, Rolm GalaStevi, PA-C  omeprazole (PRILOSEC) 20 MG capsule Take 1 capsule (20 mg total) by mouth daily. 01/10/12   Wayland SalinasBednar, John, MD  Suvorexant (BELSOMRA) 20 MG TABS Take 20 mg by mouth at bedtime as needed. 02/14/14   Anson FretAhern, Antonia B, MD    Family History Family History  Problem Relation Age of Onset  . Sleep apnea Mother   . Seizures Neg Hx     Social History Social History   Tobacco Use  . Smoking status: Never Smoker  . Smokeless tobacco: Never Used  Substance Use Topics  . Alcohol use: Yes    Alcohol/week: 8.4 oz    Types: 14 Cans of beer per week  . Drug use: Not on file     Allergies   Patient has  no known allergies.   Review of Systems Review of Systems  All other systems reviewed and are negative.    Physical Exam Updated Vital Signs BP (!) 141/88   Pulse 61   Temp 98.1 F (36.7 C) (Oral)   Resp 18   Ht 5\' 10"  (1.778 m)   Wt 99.8 kg (220 lb)   SpO2 99%   BMI 31.57 kg/m   Physical Exam  Constitutional: He is oriented to person, place, and time. He appears well-developed and well-nourished.  HENT:  Head: Normocephalic and atraumatic.  Eyes: Pupils are equal, round, and reactive to light. EOM are normal.  Neck: Normal range of motion.  Cardiovascular: Normal rate, regular rhythm, normal heart sounds and intact distal pulses.  Pulmonary/Chest: Effort normal and breath sounds normal. No respiratory distress.  Abdominal: Soft. He exhibits no distension. There is no tenderness.  Musculoskeletal: Normal range of motion.  Neurological: He is alert and oriented to person, place, and time.  5/5 strength in major muscle groups of  bilateral upper and lower extremities. Speech normal. No facial asymetry.   Skin: Skin is warm and dry.  Psychiatric: He has a normal mood and affect. Judgment normal.  Nursing note and vitals reviewed.    ED  Treatments / Results  Labs (all labs ordered are listed, but only abnormal results are displayed) Labs Reviewed  BASIC METABOLIC PANEL - Abnormal; Notable for the following components:      Result Value   Calcium 8.7 (*)    All other components within normal limits  CBC    EKG None  Radiology No results found.  Procedures Procedures (including critical care time)  Medications Ordered in ED Medications - No data to display   Initial Impression / Assessment and Plan / ED Course  I have reviewed the triage vital signs and the nursing notes.  Pertinent labs & imaging results that were available during my care of the patient were reviewed by me and considered in my medical decision making (see chart for details).      Patient is overall well-appearing.  Normal neurologic exam.  Discharged home in good condition.  Primary care follow-up  Final Clinical Impressions(s) / ED Diagnoses   Final diagnoses:  Dizziness    ED Discharge Orders    None       Azalia Bilis, MD 10/04/17 1540

## 2017-10-04 NOTE — ED Triage Notes (Signed)
Presents with one week of dizziness-described as "feeling off balance, it gets really bad when I look up" Dizziness is present with eyes open or closed. HE states today the dizziness is worse than it has been, so much that he had to leave work. Nothing makes it better. HE also reports left forearm numbness to the tips of his fingers that is only ocurring at night while sleeping, he denies numbness now.

## 2019-01-13 DIAGNOSIS — Z20828 Contact with and (suspected) exposure to other viral communicable diseases: Secondary | ICD-10-CM | POA: Diagnosis not present

## 2019-02-09 DIAGNOSIS — N46023 Azoospermia due to obstruction of efferent ducts: Secondary | ICD-10-CM | POA: Diagnosis not present

## 2019-05-01 ENCOUNTER — Ambulatory Visit: Payer: BLUE CROSS/BLUE SHIELD | Admitting: Family Medicine

## 2023-02-08 ENCOUNTER — Other Ambulatory Visit (HOSPITAL_COMMUNITY): Payer: Self-pay

## 2023-02-08 MED ORDER — WEGOVY 0.25 MG/0.5ML ~~LOC~~ SOAJ
SUBCUTANEOUS | 0 refills | Status: AC
  Filled 2023-02-08: qty 2, 30d supply, fill #0

## 2023-03-21 ENCOUNTER — Other Ambulatory Visit (HOSPITAL_COMMUNITY): Payer: Self-pay

## 2023-03-21 MED ORDER — WEGOVY 0.5 MG/0.5ML ~~LOC~~ SOAJ
0.5000 mg | SUBCUTANEOUS | 0 refills | Status: AC
  Filled 2023-03-21: qty 2, 28d supply, fill #0

## 2023-03-23 ENCOUNTER — Other Ambulatory Visit (HOSPITAL_COMMUNITY): Payer: Self-pay

## 2023-03-24 ENCOUNTER — Other Ambulatory Visit (HOSPITAL_COMMUNITY): Payer: Self-pay

## 2023-03-24 MED ORDER — ERGOCALCIFEROL 1.25 MG (50000 UT) PO CAPS
50000.0000 [IU] | ORAL_CAPSULE | ORAL | 1 refills | Status: AC
  Filled 2023-03-24: qty 12, 84d supply, fill #0

## 2023-04-22 ENCOUNTER — Other Ambulatory Visit (HOSPITAL_BASED_OUTPATIENT_CLINIC_OR_DEPARTMENT_OTHER): Payer: Self-pay

## 2023-04-22 ENCOUNTER — Other Ambulatory Visit (HOSPITAL_COMMUNITY): Payer: Self-pay

## 2023-04-22 MED ORDER — WEGOVY 1 MG/0.5ML ~~LOC~~ SOAJ
1.0000 mg | SUBCUTANEOUS | 0 refills | Status: AC
  Filled 2023-04-22: qty 2, 28d supply, fill #0

## 2023-05-24 ENCOUNTER — Other Ambulatory Visit (HOSPITAL_COMMUNITY): Payer: Self-pay

## 2023-05-24 MED ORDER — VITAMIN D (ERGOCALCIFEROL) 1.25 MG (50000 UNIT) PO CAPS
50000.0000 [IU] | ORAL_CAPSULE | ORAL | 1 refills | Status: AC
  Filled 2023-05-24: qty 12, 84d supply, fill #0

## 2023-05-24 MED ORDER — WEGOVY 1.7 MG/0.75ML ~~LOC~~ SOAJ
1.7000 mg | SUBCUTANEOUS | 0 refills | Status: AC
  Filled 2023-05-24: qty 3, 28d supply, fill #0

## 2023-05-25 ENCOUNTER — Other Ambulatory Visit (HOSPITAL_COMMUNITY): Payer: Self-pay

## 2023-06-20 ENCOUNTER — Other Ambulatory Visit (HOSPITAL_COMMUNITY): Payer: Self-pay

## 2023-06-20 MED ORDER — ERGOCALCIFEROL 1.25 MG (50000 UT) PO CAPS
50000.0000 [IU] | ORAL_CAPSULE | ORAL | 5 refills | Status: AC
  Filled 2023-06-20: qty 4, 28d supply, fill #0

## 2023-06-20 MED ORDER — WEGOVY 2.4 MG/0.75ML ~~LOC~~ SOAJ
2.4000 mg | SUBCUTANEOUS | 0 refills | Status: DC
  Filled 2023-06-20: qty 3, 28d supply, fill #0

## 2023-07-22 ENCOUNTER — Other Ambulatory Visit (HOSPITAL_COMMUNITY): Payer: Self-pay

## 2023-07-22 MED ORDER — WEGOVY 2.4 MG/0.75ML ~~LOC~~ SOAJ
2.4000 mg | SUBCUTANEOUS | 3 refills | Status: AC
  Filled 2023-07-22: qty 3, 28d supply, fill #0

## 2023-07-22 MED ORDER — ERGOCALCIFEROL 1.25 MG (50000 UT) PO CAPS
50000.0000 [IU] | ORAL_CAPSULE | ORAL | 5 refills | Status: AC
  Filled 2023-07-22: qty 4, 28d supply, fill #0

## 2023-07-23 ENCOUNTER — Other Ambulatory Visit (HOSPITAL_COMMUNITY): Payer: Self-pay

## 2023-08-01 ENCOUNTER — Other Ambulatory Visit (HOSPITAL_COMMUNITY): Payer: Self-pay

## 2023-08-02 ENCOUNTER — Other Ambulatory Visit (HOSPITAL_COMMUNITY): Payer: Self-pay
